# Patient Record
Sex: Female | Born: 1980 | Race: White | Hispanic: No | Marital: Single | Smoking: Heavy tobacco smoker
Health system: Southern US, Community
[De-identification: ages and names within clinical notes are randomized; demographics above are authoritative.]

## PROBLEM LIST (undated history)

## (undated) DIAGNOSIS — J40 Bronchitis, not specified as acute or chronic: Secondary | ICD-10-CM

---

## 2002-11-22 ENCOUNTER — Other Ambulatory Visit: Admission: RE | Admit: 2002-11-22 | Discharge: 2002-11-22 | Payer: Self-pay | Admitting: Obstetrics and Gynecology

## 2003-01-10 ENCOUNTER — Ambulatory Visit (HOSPITAL_COMMUNITY): Admission: RE | Admit: 2003-01-10 | Discharge: 2003-01-10 | Payer: Self-pay | Admitting: Obstetrics and Gynecology

## 2003-01-10 ENCOUNTER — Encounter: Payer: Self-pay | Admitting: Obstetrics and Gynecology

## 2003-03-20 ENCOUNTER — Inpatient Hospital Stay (HOSPITAL_COMMUNITY): Admission: AD | Admit: 2003-03-20 | Discharge: 2003-03-20 | Payer: Self-pay | Admitting: Obstetrics and Gynecology

## 2003-05-08 ENCOUNTER — Other Ambulatory Visit: Admission: RE | Admit: 2003-05-08 | Discharge: 2003-05-08 | Payer: Self-pay | Admitting: Obstetrics and Gynecology

## 2003-06-26 ENCOUNTER — Inpatient Hospital Stay (HOSPITAL_COMMUNITY): Admission: AD | Admit: 2003-06-26 | Discharge: 2003-06-29 | Payer: Self-pay | Admitting: Obstetrics and Gynecology

## 2003-07-19 ENCOUNTER — Encounter: Admission: RE | Admit: 2003-07-19 | Discharge: 2003-08-18 | Payer: Self-pay | Admitting: Obstetrics and Gynecology

## 2003-09-18 ENCOUNTER — Encounter: Admission: RE | Admit: 2003-09-18 | Discharge: 2003-10-18 | Payer: Self-pay | Admitting: Obstetrics and Gynecology

## 2003-11-18 ENCOUNTER — Encounter: Admission: RE | Admit: 2003-11-18 | Discharge: 2003-12-18 | Payer: Self-pay | Admitting: Obstetrics and Gynecology

## 2003-12-19 ENCOUNTER — Encounter: Admission: RE | Admit: 2003-12-19 | Discharge: 2004-01-18 | Payer: Self-pay | Admitting: Obstetrics and Gynecology

## 2009-07-26 ENCOUNTER — Emergency Department (HOSPITAL_COMMUNITY): Admission: EM | Admit: 2009-07-26 | Discharge: 2009-07-27 | Payer: Self-pay | Admitting: Emergency Medicine

## 2010-02-18 ENCOUNTER — Emergency Department (HOSPITAL_COMMUNITY): Admission: EM | Admit: 2010-02-18 | Discharge: 2010-02-18 | Payer: Self-pay | Admitting: Emergency Medicine

## 2010-12-23 ENCOUNTER — Emergency Department (HOSPITAL_COMMUNITY)
Admission: EM | Admit: 2010-12-23 | Discharge: 2010-12-23 | Disposition: A | Discharge status: Home / Self Care | Payer: Self-pay | Attending: Emergency Medicine | Admitting: Emergency Medicine

## 2010-12-23 DIAGNOSIS — J029 Acute pharyngitis, unspecified: Secondary | ICD-10-CM | POA: Insufficient documentation

## 2010-12-23 LAB — RAPID STREP SCREEN (MED CTR MEBANE ONLY): Streptococcus, Group A Screen (Direct): NEGATIVE

## 2011-01-12 LAB — RAPID STREP SCREEN (MED CTR MEBANE ONLY): Streptococcus, Group A Screen (Direct): POSITIVE — AB

## 2011-01-28 LAB — STREP A DNA PROBE: Group A Strep Probe: NEGATIVE

## 2011-01-28 LAB — RAPID STREP SCREEN (MED CTR MEBANE ONLY): Streptococcus, Group A Screen (Direct): NEGATIVE
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
## 2011-03-12 NOTE — H&P (Signed)
   [name], [name]                         ACCOUNT NO.:  [id number]   MEDICAL RECORD NO.:  [id number]                   PATIENT TYPE:  INP   LOCATION:  9170                                 [facility]   PHYSICIAN:  Janine Limbo, M.D.            DATE OF BIRTH:  1981/08/09   DATE OF ADMISSION:  06/26/2003  DATE OF DISCHARGE:                                HISTORY & PHYSICAL   HISTORY OF PRESENT ILLNESS:  [name] is a 30 year old gravida 1 para 0 at  68 and five-sevenths weeks; EDD determined by LMP dates and confirmed with  10 week six day ultrasound and follow-up ultrasound.  Her EDD is June 14, 2003.  She presents for induction of labor secondary to post dates.  Reports  positive fetal movement, no bleeding, no rupture of membranes.  She is  having mild irregular contractions and denies any PIH symptoms.  Her  pregnancy has been followed by the C.N.M. service at [facility] and is remarkable  for:  1. First trimester spotting.  2. Obesity.  3. Smoker - quit with positive UPT in December 2004.  4. Group B strep negative.   This is the patient's first pregnancy.  She presented to the office of [facility]  on November 22, 2002 at 10 weeks six days gestation as determined by dates  and ultrasound.  Her pregnancy has been essentially unremarkable.  She has  been size greater than dates at times.  Follow-up ultrasounds have all shown  normal growth and development.  At 32 weeks she was noted to be in the 50th  to 75th percentile with a normal AFI.  She has been normotensive with no  proteinuria throughout her pregnancy.   LABORATORY DATA:  Prenatal laboratory work on November 22, 2002:  Hemoglobin  and hematocrit 12.1 and 37.5; platelets 300,000.  Blood type and Rh O  positive, antibody screen negative.  VDRL nonreactive.  Rubella immune.  Hepatitis B surface antigen negative.  HIV nonreactive.   Dictation ended at this point.     Rica Koyanagi, C.N.M.                Janine Limbo, M.D.    SDM/MEDQ  D:  06/26/2003  T:  06/26/2003  Job:  [id number]

## 2011-12-27 ENCOUNTER — Emergency Department (HOSPITAL_COMMUNITY)
Admission: EM | Admit: 2011-12-27 | Discharge: 2011-12-28 | Disposition: A | Discharge status: Home / Self Care | Payer: Self-pay | Attending: Emergency Medicine | Admitting: Emergency Medicine

## 2011-12-27 ENCOUNTER — Emergency Department (HOSPITAL_COMMUNITY): Payer: Self-pay

## 2011-12-27 ENCOUNTER — Encounter (HOSPITAL_COMMUNITY): Payer: Self-pay | Admitting: Emergency Medicine

## 2011-12-27 DIAGNOSIS — Z87891 Personal history of nicotine dependence: Secondary | ICD-10-CM | POA: Insufficient documentation

## 2011-12-27 DIAGNOSIS — J40 Bronchitis, not specified as acute or chronic: Secondary | ICD-10-CM | POA: Insufficient documentation

## 2011-12-27 DIAGNOSIS — R000 Tachycardia, unspecified: Secondary | ICD-10-CM | POA: Insufficient documentation

## 2011-12-27 DIAGNOSIS — R05 Cough: Secondary | ICD-10-CM | POA: Insufficient documentation

## 2011-12-27 DIAGNOSIS — R059 Cough, unspecified: Secondary | ICD-10-CM | POA: Insufficient documentation

## 2011-12-27 DIAGNOSIS — R062 Wheezing: Secondary | ICD-10-CM | POA: Insufficient documentation

## 2011-12-27 MED ORDER — ALBUTEROL SULFATE (5 MG/ML) 0.5% IN NEBU
2.5000 mg | INHALATION_SOLUTION | Freq: Once | RESPIRATORY_TRACT | Status: DC
  Filled 2011-12-27 (×2): qty 1

## 2011-12-27 NOTE — ED Notes (Signed)
PT. REPORTS SOB WITH PRODUCTIVE COUGH FOR 7 MONTHS UNRELIEVED BY OTC COYGH MEDICATIONS AND MDI. DENIES FEEVR OR CHILLS.

## 2011-12-28 ENCOUNTER — Other Ambulatory Visit: Payer: Self-pay

## 2011-12-28 ENCOUNTER — Telehealth: Payer: Self-pay | Admitting: Adult Health

## 2011-12-28 MED ORDER — ALBUTEROL SULFATE (5 MG/ML) 0.5% IN NEBU: 2.5000 mg | INHALATION_SOLUTION | Freq: Once | RESPIRATORY_TRACT | Status: DC

## 2011-12-28 MED ORDER — IPRATROPIUM BROMIDE 0.02 % IN SOLN
0.5000 mg | Freq: Once | RESPIRATORY_TRACT | Status: AC
  Administered 2011-12-28: 0.5 mg via RESPIRATORY_TRACT
  Filled 2011-12-28: qty 2.5

## 2011-12-28 MED ORDER — ALBUTEROL SULFATE (5 MG/ML) 0.5% IN NEBU
5.0000 mg | INHALATION_SOLUTION | Freq: Once | RESPIRATORY_TRACT | Status: AC
  Administered 2011-12-28: 5 mg via RESPIRATORY_TRACT

## 2011-12-28 MED ORDER — PREDNISONE 20 MG PO TABS: 40.0000 mg | ORAL_TABLET | Freq: Every day | ORAL | Status: AC

## 2011-12-28 MED ORDER — PREDNISONE 20 MG PO TABS
40.0000 mg | ORAL_TABLET | Freq: Once | ORAL | Status: AC
  Administered 2011-12-28: 40 mg via ORAL
  Filled 2011-12-28: qty 2

## 2011-12-28 MED ORDER — ALBUTEROL SULFATE HFA 108 (90 BASE) MCG/ACT IN AERS: 1.0000 | INHALATION_SPRAY | Freq: Four times a day (QID) | RESPIRATORY_TRACT | Status: DC | PRN

## 2011-12-28 MED ORDER — PREDNISONE 20 MG PO TABS
ORAL_TABLET | ORAL | Status: AC
  Filled 2011-12-28: qty 1

## 2011-12-28 NOTE — Telephone Encounter (Signed)
Called, spoke with pt who states she was seen in the ER yesterday and was told to call here for an appt with Dr. Kriste Basque.  I put pt on hold to look up ER information but then was disconnected. I called back but had to lmomtcb.  We need to find out from pt: 1.  Did they want her to establish with Dr. Kriste Basque for PCP or want her to call for appt for pulmonary f/u?  Per Tammy D, if they wanted pt to establish with Dr. Kriste Basque for PCP care, we can tell pt to call [facility] bc he is no longer accepting new pts.

## 2011-12-28 NOTE — Discharge Instructions (Signed)
Bronchitis Bronchitis is a problem of the air tubes leading to your lungs. This problem makes it hard for air to get in and out of the lungs. You may cough a lot because your air tubes are narrow. Going without care can cause lasting (chronic) bronchitis. HOME CARE   Drink enough fluids to keep your pee (urine) clear or pale yellow.   Use a cool mist humidifier.   Quit smoking if you smoke. If you keep smoking, the bronchitis might not get better.   Only take medicine as told by your doctor.  GET HELP RIGHT AWAY IF:   Coughing keeps you awake.   You start to wheeze.   You become more sick or weak.   You have a hard time breathing or get short of breath.   You cough up blood.   Coughing lasts more than 2 weeks.   You have a fever.   Your baby is older than 3 months with a rectal temperature of 102 F (38.9 C) or higher.   Your baby is 3 months old or younger with a rectal temperature of 100.4 F (38 C) or higher.  MAKE SURE YOU:  Understand these instructions.   Will watch your condition.   Will get help right away if you are not doing well or get worse.  Document Released: 03/29/2008 Document Revised: 09/30/2011 Document Reviewed: 09/12/2009 [facility] Patient Information 2012 [place], [place].  RESOURCE GUIDE  Dental Problems  Patients with Medicaid: [facility] [phone] [address]                                           [phone] [address] Phone:  [phone]                                                  Phone:  [phone]  If unable to pay or uninsured, contact:  [facility] or [facility]. to become qualified for the adult dental clinic.  Chronic Pain Problems Contact [facility]  [phone] Patients need to be referred by their primary care doctor.  Insufficient Money for Medicine Contact United Way:  call "211" or [facility] ([phone].  No Primary Care  Doctor Call [facility]  [phone] Other agencies that provide inexpensive medical care    [facility]  [phone]    [facility]  [phone]    [facility]  [phone]    [facility]  [phone]    [facility]  [phone]    [facility]  [phone]  Psychological Services [facility]  [phone] [facility] Services  [phone] [facility]   [phone] (emergency services [phone])  Substance Abuse Resources [facility]  [phone] [facility] [phone] [facility] [phone] Floydene Flock [phone] Residential & Outpatient Substance Abuse Program  [phone]  Abuse/Neglect [facility] Child Abuse Hotline (336) 629-9692 [facility] Child Abuse Hotline (272)847-8660 (After Hours)  Emergency Shelter [facility] [phone]  Maternity Homes [facility] [phone]  [employer] Services [phone]  MRSA Hotline #:   (754)398-1929    [facility] Resources  [facility]     United Way                          [facility] Dept. [address] [place]                       [address]      [address]  Blondell Reveal Phone:  [phone]                                   Phone:  [phone]                 Phone:  [phone]  [facility] Phone:  [phone]  [facility] Child Abuse Hotline [phone] (After Hours)

## 2011-12-28 NOTE — ED Notes (Signed)
Pt states that she has been having a cold since Sept. Pt states that she has been given different medications for the cold such as antibiotics, cortisone and steriods and she still has episodes where she feels like she is having difficulty breathing. Pt states that the old starts nasally and then she feels like it settles in her chest. Pt alert and oriented able to follow commands and is in no respiratory distress at this time.

## 2011-12-29 NOTE — Telephone Encounter (Signed)
LMOMTCB x 1 

## 2011-12-29 NOTE — Telephone Encounter (Signed)
I spoke with the pt and she states she hs had bronchitis x 6 months. She hs been going to urgent care and ER and has been given abx and prednisone without relief.  ER told her to F/u with Dr.Nadel, but sounds like pt needs pulmonary consult so appt set with Dr. Sherene Sires for 01-10-12 as self-referral. Pt aware. Carron Curie, CMA'

## 2012-01-03 NOTE — ED Provider Notes (Signed)
History    31 year old female with cough. Patient states that it feels like she's had a cold since September. Patient has been evaluated multiple times in urgent care and  in emergency room. Has had several courses of antibiotics and  steroids. Symptoms have waxed and waned but have not completely resolved. No fevers or chills. No unusual leg pain or swelling. Denies history of blood clot. Patient is a smoker.  CSN: [id number]  Arrival date & time 12/27/11  2228   First MD Initiated Contact with Patient 12/28/11 0107      Chief Complaint  Patient presents with  . Shortness of Breath    (Consider location/radiation/quality/duration/timing/severity/associated sxs/prior treatment) HPI  History reviewed. No pertinent past medical history.  History reviewed. No pertinent past surgical history.  No family history on file.  History  Substance Use Topics  . Smoking status: Former Games developer  . Smokeless tobacco: Not on file  . Alcohol Use: No    OB History    Grav Para Term Preterm Abortions TAB SAB Ect Mult Living                  Review of Systems   Review of symptoms negative unless otherwise noted in HPI.  Allergies  Review of patient's allergies indicates no known allergies.  Home Medications   Current Outpatient Rx  Name Route Sig Dispense Refill  . ALBUTEROL SULFATE HFA 108 (90 BASE) MCG/ACT IN AERS Inhalation Inhale 2 puffs into the lungs every 6 (six) hours as needed. For shortness of breath    . OVER THE COUNTER MEDICATION Oral Take 2 tablets by mouth 3 (three) times daily. Cold Multi-Symptom Severe    . ALBUTEROL SULFATE HFA 108 (90 BASE) MCG/ACT IN AERS Inhalation Inhale 1-2 puffs into the lungs every 6 (six) hours as needed for wheezing. 1 Inhaler 2  . PREDNISONE 20 MG PO TABS Oral Take 2 tablets (40 mg total) by mouth daily. 10 tablet 0    BP 105/50  Pulse 102  Temp(Src) 99 F (37.2 C) (Oral)  Resp 23  SpO2 98%  LMP 12/13/2011  Physical Exam  Nursing  note and vitals reviewed. Constitutional: She appears well-developed and well-nourished. No distress.  HENT:  Head: Normocephalic and atraumatic.  Eyes: Conjunctivae are normal. Right eye exhibits no discharge. Left eye exhibits no discharge.  Neck: Neck supple.  Cardiovascular: Regular rhythm and normal heart sounds.  Exam reveals no gallop and no friction rub.   No murmur heard.      Mild tachycardia with a regular rhythm.  Pulmonary/Chest: No respiratory distress. She has wheezes.       Mild scattered wheezing with no increased work of breathing.  Abdominal: Soft. She exhibits no distension. There is no tenderness.  Musculoskeletal: She exhibits no edema and no tenderness.       Lower extremities symmetric as compared to each other. No calf tenderness. Negative Homans.    Neurological: She is alert.  Skin: Skin is warm and dry.  Psychiatric: She has a normal mood and affect. Her behavior is normal. Thought content normal.    ED Course  Procedures (including critical care time)  Labs Reviewed - No data to display No results found.   1. Bronchitis       MDM  31 year old female with persistent cough. Some mild wheezing on exam. Patient has had multiple evaluations in urgent care in emergency room. Patient does not carry the diagnosis of asthma but has never had a formal evaluation  for this. She does report relief when she uses her inhaler and also after she finishes a course of steroids. Consider other causes of chronic cough such as postnasal drip or GERD but doubt with wheezing. Consider PE but doubt. Mild tachycardia noted suspect that this is related beta agonist medication. Given multiple ER and urgent care evaluations without definitive diagnosis pulmonology referral was provided. Low suspicion for emergent etiology though given exam and chronicity.         Raeford Razor, MD 01/03/12 1807

## 2012-01-10 ENCOUNTER — Institutional Professional Consult (permissible substitution): Payer: Self-pay | Admitting: Internal Medicine

## 2013-02-02 IMAGING — CR DG CHEST 2V
2 series · 2 of 2 positions shown · non-contrast
Comparison: None

CLINICAL DATA: Shortness of breath, cough, recently quit smoking,
history bronchitis

CHEST - 2 VIEW

[w chest pa]
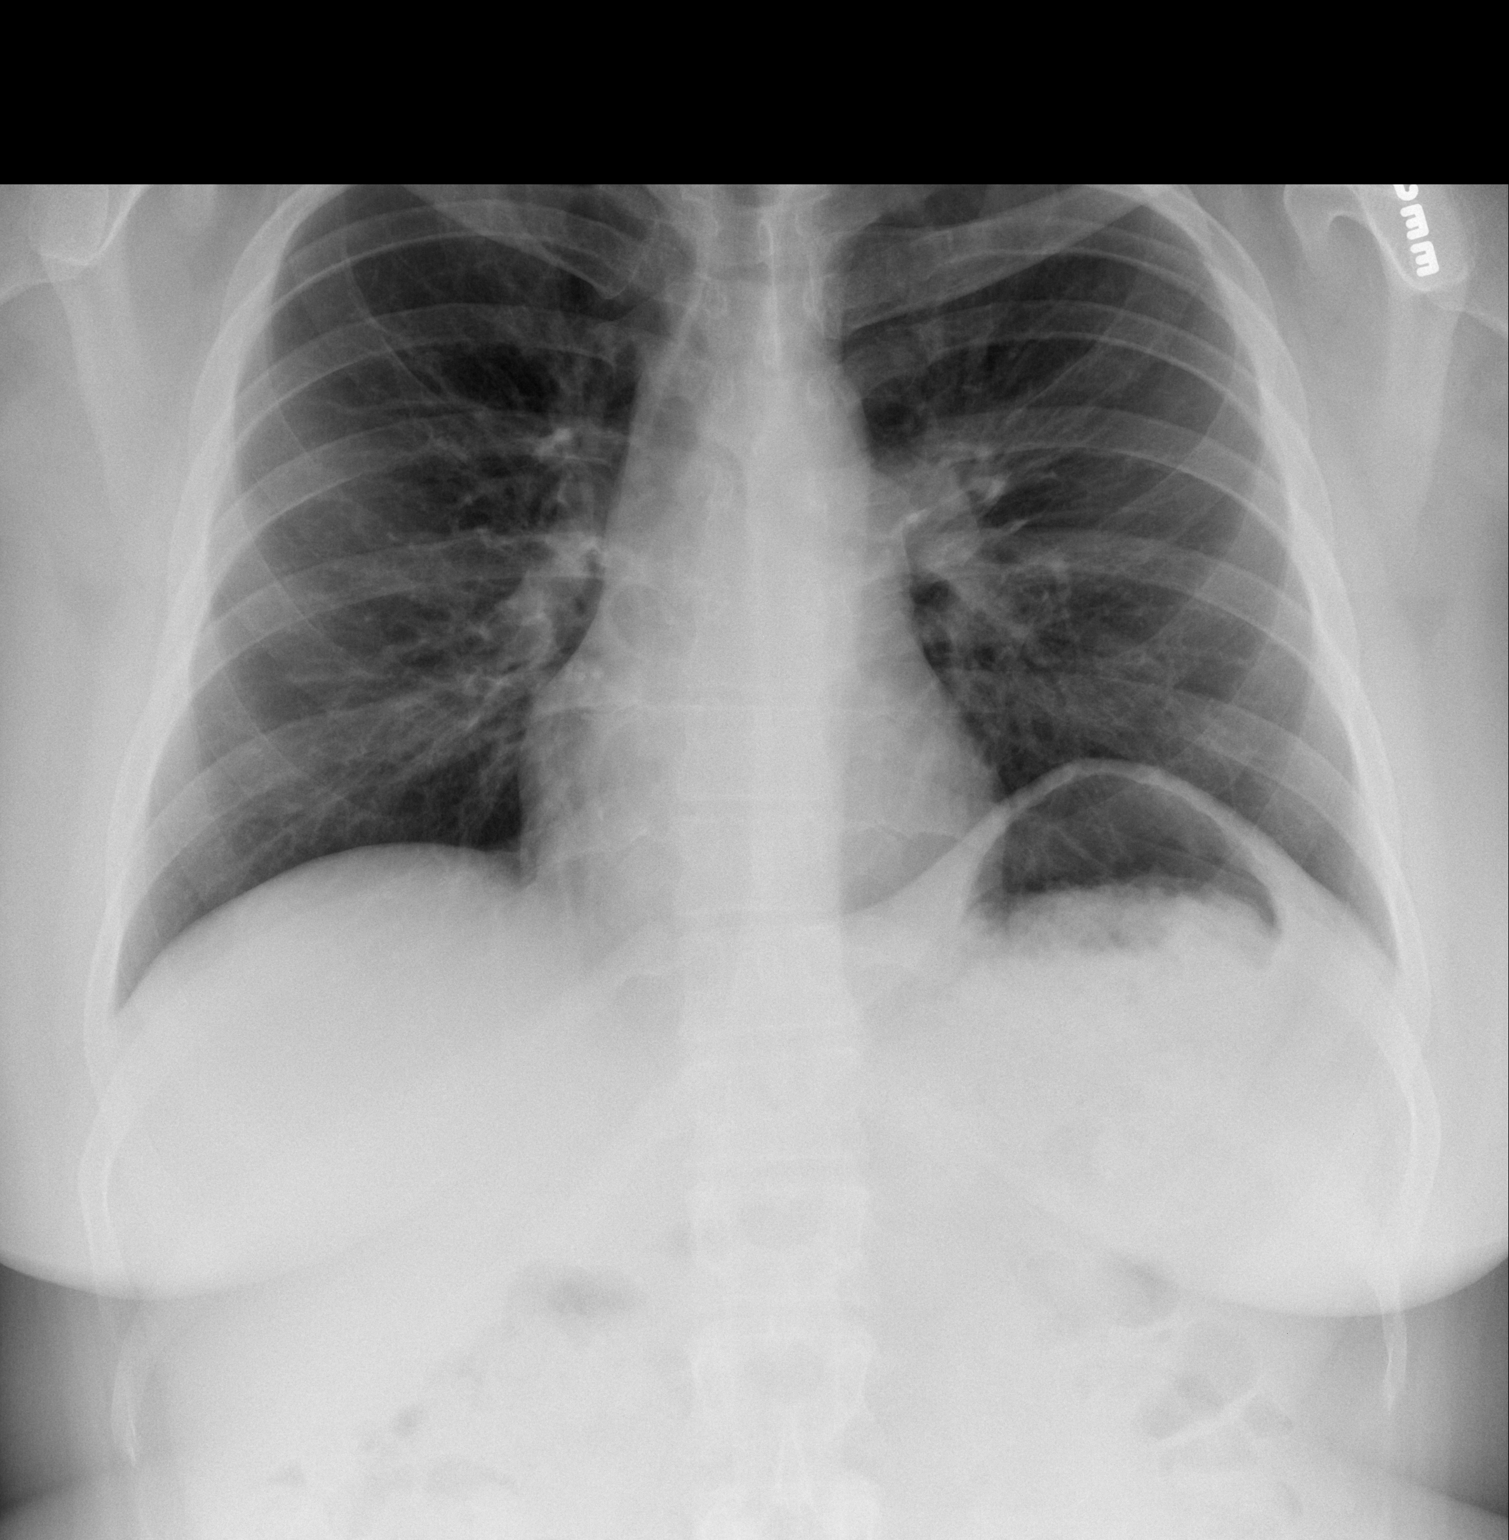

[w chest lat]
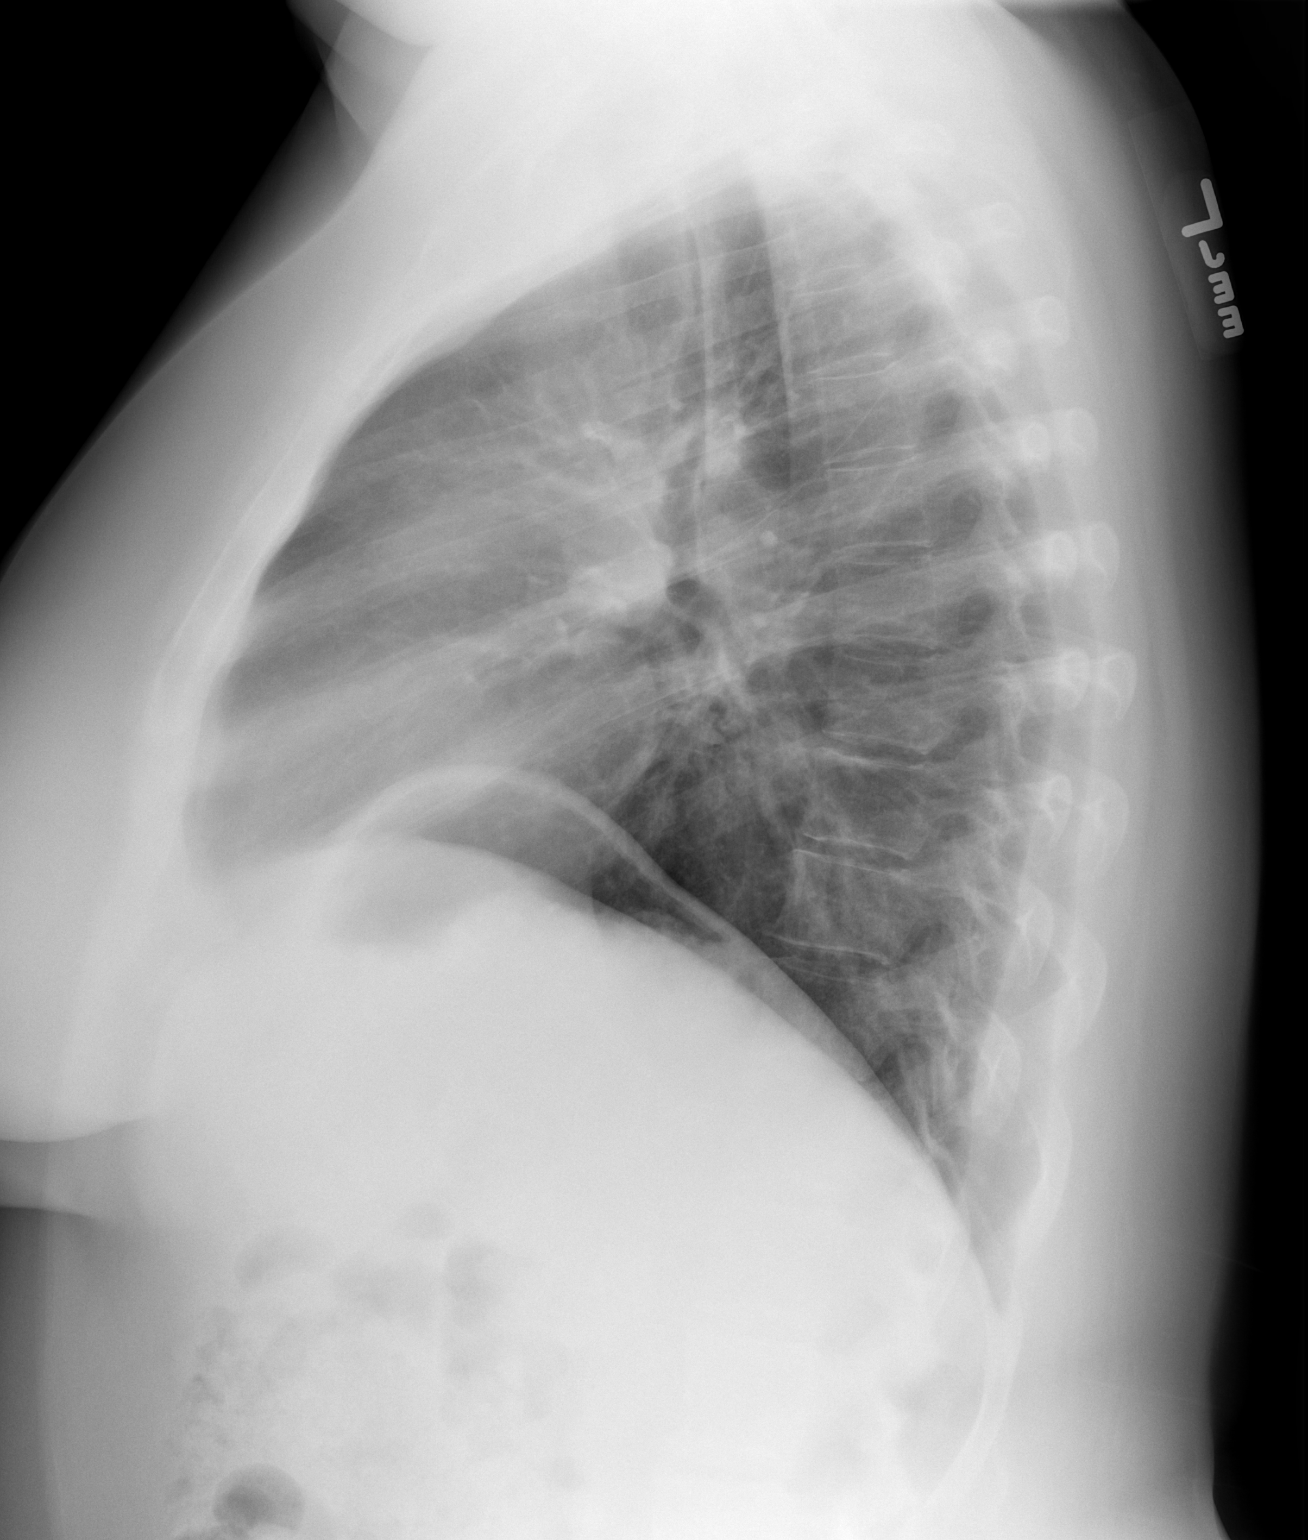

[2 of 2 positions shown; findings below may reference images not displayed]

FINDINGS: Normal heart size, mediastinal contours, and pulmonary vascularity.
Lungs clear.
Bones unremarkable.
No pneumothorax.
Incidentally noted eventration anterior left diaphragm.
IMPRESSION: No acute abnormalities.

## 2013-07-10 ENCOUNTER — Encounter (HOSPITAL_COMMUNITY): Payer: Self-pay | Admitting: Emergency Medicine

## 2013-07-10 ENCOUNTER — Emergency Department (HOSPITAL_COMMUNITY): Payer: Self-pay

## 2013-07-10 ENCOUNTER — Emergency Department (HOSPITAL_COMMUNITY)
Admission: EM | Admit: 2013-07-10 | Discharge: 2013-07-11 | Disposition: A | Discharge status: Home / Self Care | Payer: Self-pay | Attending: Emergency Medicine | Admitting: Emergency Medicine

## 2013-07-10 DIAGNOSIS — R52 Pain, unspecified: Secondary | ICD-10-CM | POA: Insufficient documentation

## 2013-07-10 DIAGNOSIS — F17200 Nicotine dependence, unspecified, uncomplicated: Secondary | ICD-10-CM | POA: Insufficient documentation

## 2013-07-10 DIAGNOSIS — R509 Fever, unspecified: Secondary | ICD-10-CM | POA: Insufficient documentation

## 2013-07-10 DIAGNOSIS — J3489 Other specified disorders of nose and nasal sinuses: Secondary | ICD-10-CM | POA: Insufficient documentation

## 2013-07-10 DIAGNOSIS — J029 Acute pharyngitis, unspecified: Secondary | ICD-10-CM | POA: Insufficient documentation

## 2013-07-10 DIAGNOSIS — J45901 Unspecified asthma with (acute) exacerbation: Secondary | ICD-10-CM | POA: Insufficient documentation

## 2013-07-10 DIAGNOSIS — Z79899 Other long term (current) drug therapy: Secondary | ICD-10-CM | POA: Insufficient documentation

## 2013-07-10 HISTORY — DX: Bronchitis, not specified as acute or chronic: J40

## 2013-07-10 MED ORDER — IPRATROPIUM BROMIDE 0.02 % IN SOLN
RESPIRATORY_TRACT | Status: AC
  Administered 2013-07-10: 0.5 mg via RESPIRATORY_TRACT
  Filled 2013-07-10: qty 2.5

## 2013-07-10 MED ORDER — IPRATROPIUM BROMIDE 0.02 % IN SOLN
0.5000 mg | Freq: Once | RESPIRATORY_TRACT | Status: AC
  Administered 2013-07-10: 0.5 mg via RESPIRATORY_TRACT

## 2013-07-10 MED ORDER — ALBUTEROL (5 MG/ML) CONTINUOUS INHALATION SOLN
10.0000 mg/h | INHALATION_SOLUTION | Freq: Once | RESPIRATORY_TRACT | Status: AC
  Administered 2013-07-10: 10 mg/h via RESPIRATORY_TRACT
  Filled 2013-07-10: qty 20

## 2013-07-10 MED ORDER — PREDNISONE 20 MG PO TABS
60.0000 mg | ORAL_TABLET | Freq: Once | ORAL | Status: AC
  Administered 2013-07-10: 60 mg via ORAL
  Filled 2013-07-10: qty 3

## 2013-07-10 MED ORDER — ALBUTEROL SULFATE (5 MG/ML) 0.5% IN NEBU
5.0000 mg | INHALATION_SOLUTION | Freq: Once | RESPIRATORY_TRACT | Status: AC
  Administered 2013-07-10: 5 mg via RESPIRATORY_TRACT
  Filled 2013-07-10: qty 1

## 2013-07-10 MED ORDER — PREDNISONE 10 MG PO TABS: 20.0000 mg | ORAL_TABLET | Freq: Every day | ORAL | Status: DC

## 2013-07-10 MED ORDER — ALBUTEROL SULFATE HFA 108 (90 BASE) MCG/ACT IN AERS
2.0000 | INHALATION_SPRAY | RESPIRATORY_TRACT | Status: DC | PRN
  Administered 2013-07-11: 2 via RESPIRATORY_TRACT
  Filled 2013-07-10: qty 6.7

## 2013-07-10 NOTE — ED Notes (Signed)
Pt reports that she has been feeling ShOB with a cough for the past few days, felt dizzy at work today and had to leave early.  Pt reports chills and generalized body aches.

## 2013-07-10 NOTE — ED Notes (Signed)
RT paged for breathing treatment at this time

## 2013-07-10 NOTE — ED Provider Notes (Signed)
CSN: [id number]     Arrival date & time 07/10/13  2012 History  This chart was scribed for non-physician practitioner, Jeanette Favor, FNP working with Jeanette Argyle, MD by Jeanette [name], ED scribe. This patient was seen in room WTR2/WLPT2 and the patient's care was started at 9:14 PM.   Chief Complaint  Patient presents with  . Shortness of Breath  . Cough   The history is provided by the patient. No language interpreter was used.    HPI Comments: Jeanette [name] is a 32 y.o. female who presents to the Emergency Department complaining of shortness of breath with associated cough that started yesterday. Pt also had chills and generalized body aches. She states she woke up with a sore throat and mild fever today. Pt has been using her son's inhaler because she can't afford one of her own since she has no insurance. She states the breathing treatment she was given in the ED is helping.   Past Medical History  Diagnosis Date  . Bronchitis    History reviewed. No pertinent past surgical history. History reviewed. No pertinent family history. History  Substance Use Topics  . Smoking status: Heavy Tobacco Smoker -- 1.00 packs/day    Types: Cigarettes  . Smokeless tobacco: Not on file  . Alcohol Use: No   OB History   Grav Para Term Preterm Abortions TAB SAB Ect Mult Living                 Review of Systems  Constitutional: Positive for fever and chills.  HENT: Positive for sore throat, rhinorrhea and postnasal drip. Negative for trouble swallowing.   Respiratory: Positive for cough, shortness of breath and wheezing.   All other systems reviewed and are negative.    Allergies  Review of patient's allergies indicates no known allergies.  Home Medications   Current Outpatient Rx  Name  Route  Sig  Dispense  Refill  . albuterol (PROVENTIL HFA;VENTOLIN HFA) 108 (90 BASE) MCG/ACT inhaler   Inhalation   Inhale 2 puffs into the lungs every 6 (six) hours as needed. For shortness  of breath         . dextromethorphan (DELSYM) 30 MG/5ML liquid   Oral   Take 60 mg by mouth as needed for cough.         . loratadine (CLARITIN) 10 MG tablet   Oral   Take 10 mg by mouth as needed for allergies.         . predniSONE (DELTASONE) 10 MG tablet   Oral   Take 2 tablets (20 mg total) by mouth daily.   15 tablet   0    BP 152/118  Pulse 118  Temp(Src) 100.7 F (38.2 C) (Oral)  Resp 22  SpO2 92%  LMP 06/19/2013  Physical Exam  Nursing note and vitals reviewed. Constitutional: She is oriented to person, place, and time. She appears well-developed and well-nourished. No distress.  HENT:  Head: Normocephalic and atraumatic.  Throat is mildly red but no exudate.   Eyes: EOM are normal.  Neck: Neck supple. No tracheal deviation present.  Cardiovascular: Normal rate.   Pulmonary/Chest: Effort normal. No respiratory distress.  Musculoskeletal: Normal range of motion.  Neurological: She is alert and oriented to person, place, and time.  Skin: Skin is warm and dry.  Psychiatric: She has a normal mood and affect. Her behavior is normal.    ED Course  Procedures (including critical care time)  DIAGNOSTIC STUDIES: Oxygen Saturation  is 92% on RA, normal by my interpretation.    COORDINATION OF CARE: 9:16 PM-Discussed treatment plan which includes steroids and inhaler with pt at bedside and pt agreed to plan.   Labs Review Labs Reviewed - No data to display Imaging Review Dg Chest 2 View  07/10/2013   CLINICAL DATA:  Chest pain and cough  EXAM: CHEST  2 VIEW  COMPARISON:  12/27/2011  FINDINGS: The heart size and mediastinal contours are within normal limits. Both lungs are clear. The visualized skeletal structures are unremarkable.  IMPRESSION: No active cardiopulmonary disease.   Electronically Signed   By: Jeanette [name]   On: 07/10/2013 21:08    MDM   1. Asthma exacerbation     Is with a history of reactive airway disease/bronchitis.  I feel that  this is exacerbation of her bronchitis.  She does smoke on a regular basis.  She states, when she uses her son's inhalers.  The breathing easier Patient received 2 treatments in the emergency Department 1 with that lasted approximately one hour.  She feels much, better.  There is no wheezing.  After the second hour-long treatment.  Her O2 sats are still low 90s.  She was ambulated.  Her sats stayed between 89 and 91%.  Patient was asymptomatic denying dizziness.  She was steady on her feet.  She'll be discharged him with an albuterol inhaler, as well, as a prescription for steroids.  She has been informed that she should return immediately if she develops new or worsening symptoms.  She denied/refused admission     I personally performed the services described in this documentation, which was scribed in my presence. The recorded information has been reviewed and is accurate.  Jeanette Filter, NP 07/10/13 [phone]

## 2013-07-10 NOTE — ED Notes (Signed)
Pt ambulated on rm air. Pt's O2 was 91 before walking and during the walk it drooped to 89% but came back up and stayed at 90% for the remaining of the walk.

## 2013-07-11 NOTE — ED Provider Notes (Signed)
Medical screening examination/treatment/procedure(s) were performed by non-physician practitioner and as supervising physician I was immediately available for consultation/collaboration.   Junius Argyle, MD 07/11/13 [phone]

## 2014-12-06 ENCOUNTER — Emergency Department (HOSPITAL_COMMUNITY)
Admission: EM | Admit: 2014-12-06 | Discharge: 2014-12-06 | Disposition: A | Discharge status: Home / Self Care | Payer: Self-pay | Attending: Emergency Medicine | Admitting: Emergency Medicine

## 2014-12-06 ENCOUNTER — Emergency Department (HOSPITAL_COMMUNITY): Payer: Self-pay

## 2014-12-06 ENCOUNTER — Encounter (HOSPITAL_COMMUNITY): Payer: Self-pay | Admitting: Emergency Medicine

## 2014-12-06 DIAGNOSIS — Z720 Tobacco use: Secondary | ICD-10-CM | POA: Insufficient documentation

## 2014-12-06 DIAGNOSIS — R0602 Shortness of breath: Secondary | ICD-10-CM

## 2014-12-06 DIAGNOSIS — J40 Bronchitis, not specified as acute or chronic: Secondary | ICD-10-CM | POA: Insufficient documentation

## 2014-12-06 DIAGNOSIS — Z7952 Long term (current) use of systemic steroids: Secondary | ICD-10-CM | POA: Insufficient documentation

## 2014-12-06 DIAGNOSIS — Z79899 Other long term (current) drug therapy: Secondary | ICD-10-CM | POA: Insufficient documentation

## 2014-12-06 MED ORDER — GUAIFENESIN ER 1200 MG PO TB12: 1.0000 | ORAL_TABLET | Freq: Two times a day (BID) | ORAL | Status: AC

## 2014-12-06 MED ORDER — PREDNISONE 20 MG PO TABS
60.0000 mg | ORAL_TABLET | Freq: Once | ORAL | Status: AC
  Administered 2014-12-06: 60 mg via ORAL
  Filled 2014-12-06: qty 3

## 2014-12-06 MED ORDER — ALBUTEROL SULFATE HFA 108 (90 BASE) MCG/ACT IN AERS
2.0000 | INHALATION_SPRAY | RESPIRATORY_TRACT | Status: DC | PRN
  Filled 2014-12-06: qty 6.7

## 2014-12-06 MED ORDER — AEROCHAMBER PLUS FLO-VU MEDIUM MISC
1.0000 | Freq: Once | Status: DC
  Filled 2014-12-06 (×2): qty 1

## 2014-12-06 MED ORDER — PREDNISONE 50 MG PO TABS: 50.0000 mg | ORAL_TABLET | Freq: Every day | ORAL | Status: AC

## 2014-12-06 MED ORDER — ALBUTEROL (5 MG/ML) CONTINUOUS INHALATION SOLN
10.0000 mg/h | INHALATION_SOLUTION | RESPIRATORY_TRACT | Status: AC
  Administered 2014-12-06: 10 mg/h via RESPIRATORY_TRACT
  Filled 2014-12-06: qty 20

## 2014-12-06 MED ORDER — ACETAMINOPHEN-CODEINE 120-12 MG/5ML PO SOLN: 10.0000 mL | ORAL | Status: AC | PRN

## 2014-12-06 NOTE — Progress Notes (Signed)
EDCM spoke to patient at bedside. Patient confirms she does not have a pcp or insurance [place].  EDCM provide patient with pamphlet to [facility], informed patient of services there and walk in times.  EDCM also provided patient with list of pcps who accept self pay patients, list of discount pharmacies and websites needymeds.org and GoodRX.com for medication assistance, phone number to inquire about the orange card, phone number to inquire about Mediciad, phone number to inquire about the Affordable Care Act, financial resources in the community such as local churches, salvation army, urban ministries, and dental assistance for uninsured patients.  [facility] information [facility]  [facility] center and provided hours of operation. Patient thankful for resources.  No further EDCM needs at this time.

## 2014-12-06 NOTE — Discharge Instructions (Signed)
Return here as needed.  Follow-up with the clinic provided.  Increase your fluid intake and rest as much as possible °

## 2014-12-06 NOTE — ED Provider Notes (Signed)
CSN: [id number]     Arrival date & time 12/06/14  4098 History   First MD Initiated Contact with Patient 12/06/14 1956     Chief Complaint  Patient presents with  . Shortness of Breath     (Consider location/radiation/quality/duration/timing/severity/associated sxs/prior Treatment) HPI  Patient with PMH of asthma exacerbations and is a current smoker presents today complaining of SOB and prolonged cough since December 2015.  At the beginning of December she noticed a sore throat, cough, and fever.  It resolved, however the cough remained.  Now, she endorses SOB and a productive cough with occasional blood tinged sputum.  Associated sxs include night sweats and rhinorrhea.  Denies fever, HA, eye pain, nuchal rigidity, sore throat, facial pain, otalgia, abdominal pain, N/V/D/C.  It is worse at night.  She did use an expired albuterol inhaler and found periodic relief.   Past Medical History  Diagnosis Date  . Bronchitis    History reviewed. No pertinent past surgical history. History reviewed. No pertinent family history. History  Substance Use Topics  . Smoking status: Heavy Tobacco Smoker -- 1.00 packs/day    Types: Cigarettes  . Smokeless tobacco: Not on file  . Alcohol Use: No   OB History    No data available     Review of Systems All other systems negative except as documented in the HPI. All pertinent positives and negatives as reviewed in the HPI.   Allergies  Review of patient's allergies indicates no known allergies.  Home Medications   Prior to Admission medications   Medication Sig Start Date End Date Taking? Authorizing Provider  albuterol (PROVENTIL HFA;VENTOLIN HFA) 108 (90 BASE) MCG/ACT inhaler Inhale 2 puffs into the lungs every 6 (six) hours as needed for shortness of breath (shortness of breath). For shortness of breath   Yes Historical Provider, MD  Dextromethorphan-Guaifenesin (MUCINEX DM PO) Take 30 mLs by mouth at bedtime as needed (cough).   Yes  Historical Provider, MD  sodium-potassium bicarbonate (ALKA-SELTZER GOLD) TBEF dissolvable tablet Take 1 tablet by mouth daily as needed (cold symptoms).   Yes Historical Provider, MD  Throat Lozenges (LOZENGES MT) Use as directed 1 lozenge in the mouth or throat every 2 (two) hours as needed (cough).   Yes Historical Provider, MD  loratadine (CLARITIN) 10 MG tablet Take 10 mg by mouth as needed for allergies.    Historical Provider, MD  predniSONE (DELTASONE) 10 MG tablet Take 2 tablets (20 mg total) by mouth daily. Patient not taking: Reported on 12/06/2014 07/10/13   Arman Filter, NP   BP 114/66 mmHg  Pulse 80  Temp(Src) 98 F (36.7 C) (Oral)  Resp 16  SpO2 95%  LMP 11/05/2014 Physical Exam  Constitutional: She is oriented to person, place, and time. She appears well-developed and well-nourished. No distress.  HENT:  Head: Normocephalic and atraumatic.  Right Ear: External ear normal.  Left Ear: External ear normal.  Nose: Nose normal.  Mouth/Throat: Uvula is midline, oropharynx is clear and moist and mucous membranes are normal. No uvula swelling. No oropharyngeal exudate, posterior oropharyngeal edema, posterior oropharyngeal erythema or tonsillar abscesses.  Eyes: Conjunctivae and EOM are normal. Pupils are equal, round, and reactive to light.  Neck: Normal range of motion. Neck supple.  Cardiovascular: Normal rate, regular rhythm and normal heart sounds.   No murmur heard. Pulmonary/Chest: Effort normal. No stridor. No respiratory distress. She has wheezes in the right upper field, the right middle field, the right lower field, the left upper field,  the left middle field and the left lower field. She has rhonchi in the right lower field and the left lower field.  Abdominal: Soft. She exhibits no distension.  Neurological: She is alert and oriented to person, place, and time.  Skin: Skin is warm and dry.    ED Course  Procedures (including critical care time)  Imaging  Review Dg Chest 2 View  12/06/2014   CLINICAL DATA:  Chronic onset of cough, congestion, shortness of breath and chest pain. Initial encounter.  EXAM: CHEST  2 VIEW  COMPARISON:  Chest radiograph performed 07/10/2013  FINDINGS: The lungs are well-aerated. There is mild stable elevation of the left hemidiaphragm, reflecting air within the stomach. There is no evidence of focal opacification, pleural effusion or pneumothorax.  The heart is normal in size; the mediastinal contour is within normal limits. No acute osseous abnormalities are seen.  IMPRESSION: No acute cardiopulmonary process seen.   Electronically Signed   By: Roanna RaiderJeffery  Chang M.D.   On: 12/06/2014 21:15     EKG Interpretation   Date/Time:  Friday December 06 2014 19:05:34 EST Ventricular Rate:  80 PR Interval:  121 QRS Duration: 78 QT Interval:  366 QTC Calculation: 422 R Axis:   51 Text Interpretation:  Sinus rhythm Confirmed by Freida BusmanALLEN  MD, ANTHONY ([id number])  on 12/06/2014 9:50:55 PM       I should be treated for bronchitis and advised to follow-up with her primary care doctor.  The patient is feeling fast improvement following an hour-long neb treatment and steroids.  I advised her to increase her fluid intake and rest as much as possible and also advised her that cutting down smoking will help her condition dramatically  MDM   Final diagnoses:  SOB (shortness of breath)         Carlyle DollyChristopher W Altheria Shadoan, PA-C 12/06/14 2229  Toy BakerAnthony T Allen, MD 12/07/14 1517

## 2014-12-06 NOTE — ED Notes (Signed)
Pt reports she has been sick for about 2 months. Pt states she has a similar flare up a year ago and was dx with bronchitis or asthma (unable to recall). Pt is alert and oriented. Productive cough present with slight labored breathing.

## 2019-01-17 ENCOUNTER — Telehealth: Payer: Self-pay | Admitting: Family

## 2019-01-17 DIAGNOSIS — Z7189 Other specified counseling: Secondary | ICD-10-CM

## 2019-01-17 DIAGNOSIS — R6889 Other general symptoms and signs: Secondary | ICD-10-CM

## 2019-01-17 MED ORDER — BENZONATATE 100 MG PO CAPS: 100.0000 mg | ORAL_CAPSULE | Freq: Three times a day (TID) | ORAL | 0 refills | Status: AC | PRN

## 2019-01-17 NOTE — Progress Notes (Signed)
E-Visit for Corona Virus Screening  Based on your current symptoms, you may very well have the virus, however your symptoms are mild. Currently, not all patients are being tested. If the symptoms are mild and there is not a known exposure, performing the test is not indicated.   Approximately 5 minutes was spent documenting and reviewing patient's chart.    Coronavirus disease 2019 (COVID-19) is a respiratory illness that can spread from person to person. The virus that causes COVID-19 is a new virus that was first identified in the country of [place] but is now found in multiple other countries and has spread to the [place].  Symptoms associated with the virus are mild to severe fever, cough, and shortness of breath. There is currently no vaccine to protect against COVID-19, and there is no specific antiviral treatment for the virus.   To be considered HIGH RISK for Coronavirus (COVID-19), you have to meet the following criteria:  . Traveled to [place], [place], [place], [place] or [place]; or in the [place] to [place], [place], [place], or [place]; and have fever, cough, and shortness of breath within the last 2 weeks of travel OR  . Been in close contact with a person diagnosed with COVID-19 within the last 2 weeks and have fever, cough, and shortness of breath  . IF YOU DO NOT MEET THESE CRITERIA, YOU ARE CONSIDERED LOW RISK FOR COVID-19.   It is vitally important that if you feel that you have an infection such as this virus or any other virus that you stay home and away from places where you may spread it to others.  You should self-quarantine for 14 days if you have symptoms that could potentially be coronavirus and avoid contact with people age 65 and older.   You can use medication such as A prescription cough medication called Tessalon Perles 100 mg. You may take 1-2 capsules every 8 hours as needed for cough  You may also take acetaminophen (Tylenol) as needed for  fever.   Reduce your risk of any infection by using the same precautions used for avoiding the common cold or flu:  . Wash your hands often with soap and warm water for at least 20 seconds.  If soap and water are not readily available, use an alcohol-based hand sanitizer with at least 60% alcohol.  . If coughing or sneezing, cover your mouth and nose by coughing or sneezing into the elbow areas of your shirt or coat, into a tissue or into your sleeve (not your hands). . Avoid shaking hands with others and consider head nods or verbal greetings only. . Avoid touching your eyes, nose, or mouth with unwashed hands.  . Avoid close contact with people who are sick. . Avoid places or events with large numbers of people in one location, like concerts or sporting events. . Carefully consider travel plans you have or are making. . If you are planning any travel outside or inside the [place], visit the CDC's Travelers' Health webpage for the latest health notices. . If you have some symptoms but not all symptoms, continue to monitor at home and seek medical attention if your symptoms worsen. . If you are having a medical emergency, call 911.  HOME CARE . Only take medications as instructed by your medical team. . Drink plenty of fluids and get plenty of rest. . A steam or ultrasonic humidifier can help if you have congestion.   GET HELP RIGHT AWAY IF: .   You develop worsening fever. . You become short of breath . You cough up blood. . Your symptoms become more severe MAKE SURE YOU   Understand these instructions.  Will watch your condition.  Will get help right away if you are not doing well or get worse.  Your e-visit answers were reviewed by a board certified advanced clinical practitioner to complete your personal care plan.  Depending on the condition, your plan could have included both over the counter or prescription medications.  If there is a problem please reply once you have received a  response from your provider. Your safety is important to us.  If you have drug allergies check your prescription carefully.    You can use MyChart to ask questions about today's visit, request a non-urgent call back, or ask for a work or school excuse for 24 hours related to this e-Visit. If it has been greater than 24 hours you will need to follow up with your provider, or enter a new e-Visit to address those concerns. You will get an e-mail in the next two days asking about your experience.  I hope that your e-visit has been valuable and will speed your recovery. Thank you for using e-visits.    

## 2019-07-18 ENCOUNTER — Other Ambulatory Visit: Payer: Self-pay

## 2019-07-18 DIAGNOSIS — Z20822 Contact with and (suspected) exposure to covid-19: Secondary | ICD-10-CM

## 2019-07-20 LAB — NOVEL CORONAVIRUS, NAA: SARS-CoV-2, NAA: NOT DETECTED

## 2020-02-02 ENCOUNTER — Ambulatory Visit: Payer: Medicaid Other | Attending: Internal Medicine

## 2020-02-02 DIAGNOSIS — Z23 Encounter for immunization: Secondary | ICD-10-CM

## 2020-02-02 NOTE — Progress Notes (Signed)
   Covid-19 Vaccination Clinic  Name:  Jeanette [name]    MRN: [id number] DOB: [birth year]  02/02/2020  [name] was observed post Covid-19 immunization for 30 minutes based on pre-vaccination screening without incident. She was provided with Vaccine Information Sheet and instruction to access the V-Safe system.   [name] was instructed to call 911 with any severe reactions post vaccine: Marland Kitchen Difficulty breathing  . Swelling of face and throat  . A fast heartbeat  . A bad rash all over body  . Dizziness and weakness   Immunizations Administered    Name Date Dose VIS Date Route   Pfizer COVID-19 Vaccine 02/02/2020  8:32 AM 0.3 mL 10/05/2019 Intramuscular   Manufacturer: [employer], [employer]   Lot: [id number]   NDC: [address]-7

## 2020-02-25 ENCOUNTER — Ambulatory Visit: Payer: Medicaid Other | Attending: Internal Medicine

## 2020-02-25 DIAGNOSIS — Z23 Encounter for immunization: Secondary | ICD-10-CM

## 2020-02-25 NOTE — Progress Notes (Signed)
   Covid-19 Vaccination Clinic  Name:  CHERAE MARTON    MRN: [id number] DOB: 11-01-1980  02/25/2020  Ms. Lillibridge was observed post Covid-19 immunization for 30 minutes based on pre-vaccination screening without incident. She was provided with Vaccine Information Sheet and instruction to access the V-Safe system.   Ms. Plaut was instructed to call 911 with any severe reactions post vaccine: Marland Kitchen Difficulty breathing  . Swelling of face and throat  . A fast heartbeat  . A bad rash all over body  . Dizziness and weakness   Immunizations Administered    Name Date Dose VIS Date Route   Pfizer COVID-19 Vaccine 02/25/2020 12:10 PM 0.3 mL 12/19/2018 Intramuscular   Manufacturer: [employer], [employer]   Lot: [id number]   NDC: [address]-8

## 2020-05-06 DIAGNOSIS — Z20822 Contact with and (suspected) exposure to covid-19: Secondary | ICD-10-CM | POA: Diagnosis not present

## 2020-05-06 DIAGNOSIS — Z03818 Encounter for observation for suspected exposure to other biological agents ruled out: Secondary | ICD-10-CM | POA: Diagnosis not present

## 2022-06-20 ENCOUNTER — Other Ambulatory Visit: Payer: Self-pay

## 2022-06-20 ENCOUNTER — Emergency Department (HOSPITAL_COMMUNITY)
Admission: EM | Admit: 2022-06-20 | Discharge: 2022-06-20 | Discharge status: Against Medical Advice | Payer: BC Managed Care – PPO | Attending: Emergency Medicine | Admitting: Emergency Medicine

## 2022-06-20 ENCOUNTER — Emergency Department (HOSPITAL_COMMUNITY): Payer: BC Managed Care – PPO

## 2022-06-20 ENCOUNTER — Encounter (HOSPITAL_COMMUNITY): Payer: Self-pay

## 2022-06-20 DIAGNOSIS — R519 Headache, unspecified: Secondary | ICD-10-CM | POA: Diagnosis present

## 2022-06-20 DIAGNOSIS — R059 Cough, unspecified: Secondary | ICD-10-CM | POA: Diagnosis not present

## 2022-06-20 DIAGNOSIS — Z5321 Procedure and treatment not carried out due to patient leaving prior to being seen by health care provider: Secondary | ICD-10-CM | POA: Diagnosis not present

## 2022-06-20 DIAGNOSIS — R112 Nausea with vomiting, unspecified: Secondary | ICD-10-CM | POA: Diagnosis not present

## 2022-06-20 LAB — COMPREHENSIVE METABOLIC PANEL
ALT: 19 U/L (ref 0–44)
AST: 19 U/L (ref 15–41)
Albumin: 3.6 g/dL (ref 3.5–5.0)
Alkaline Phosphatase: 70 U/L (ref 38–126)
Anion gap: 7 (ref 5–15)
BUN: 10 mg/dL (ref 6–20)
CO2: 26 mmol/L (ref 22–32)
Calcium: 8.6 mg/dL — ABNORMAL LOW (ref 8.9–10.3)
Chloride: 105 mmol/L (ref 98–111)
Creatinine, Ser: 0.77 mg/dL (ref 0.44–1.00)
GFR, Estimated: >60 mL/min (ref >60)
Glucose, Bld: 103 mg/dL — ABNORMAL HIGH (ref 70–99)
Potassium: 3.7 mmol/L (ref 3.5–5.1)
Sodium: 138 mmol/L (ref 135–145)
Total Bilirubin: 0.3 mg/dL (ref 0.3–1.2)
Total Protein: 7 g/dL (ref 6.5–8.1)

## 2022-06-20 LAB — URINALYSIS, ROUTINE W REFLEX MICROSCOPIC
Bilirubin Urine: NEGATIVE
Glucose, UA: NEGATIVE mg/dL
Hgb urine dipstick: NEGATIVE
Ketones, ur: NEGATIVE mg/dL
Leukocytes,Ua: NEGATIVE
Nitrite: NEGATIVE
Protein, ur: NEGATIVE mg/dL
Specific Gravity, Urine: 1.005 (ref 1.005–1.030)
pH: 7 (ref 5.0–8.0)

## 2022-06-20 LAB — CBC WITH DIFFERENTIAL/PLATELET
Abs Immature Granulocytes: 0.03 10*3/uL (ref 0.00–0.07)
Basophils Absolute: 0.1 10*3/uL (ref 0.0–0.1)
Basophils Relative: 1 %
Eosinophils Absolute: 0.3 10*3/uL (ref 0.0–0.5)
Eosinophils Relative: 5 %
HCT: 44.8 % (ref 36.0–46.0)
Hemoglobin: 14.3 g/dL (ref 12.0–15.0)
Immature Granulocytes: 0 %
Lymphocytes Relative: 26 %
Lymphs Abs: 1.9 10*3/uL (ref 0.7–4.0)
MCH: 27.9 pg (ref 26.0–34.0)
MCHC: 31.9 g/dL (ref 30.0–36.0)
MCV: 87.3 fL (ref 80.0–100.0)
Monocytes Absolute: 0.8 10*3/uL (ref 0.1–1.0)
Monocytes Relative: 11 %
Neutro Abs: 4.1 10*3/uL (ref 1.7–7.7)
Neutrophils Relative %: 57 %
Platelets: 265 10*3/uL (ref 150–400)
RBC: 5.13 MIL/uL — ABNORMAL HIGH (ref 3.87–5.11)
RDW: 13.9 % (ref 11.5–15.5)
WBC: 7.2 10*3/uL (ref 4.0–10.5)
nRBC: 0 % (ref 0.0–0.2)

## 2022-06-20 LAB — TROPONIN I (HIGH SENSITIVITY): Troponin I (High Sensitivity): 3 ng/L (ref <18)

## 2022-06-20 NOTE — ED Triage Notes (Signed)
Pt reports having a headache Wednesday morning that has been intermittent since. Pt also endorses cough and N/V over the past few days. Denies abdominal pain, diarrhea, and fever.

## 2022-06-20 NOTE — ED Provider Triage Note (Signed)
Emergency Medicine Provider Triage Evaluation Note  Jeanette [name] , a 41 y.o. female  was evaluated in triage.  Pt complains of headache, vomiting, cough. She reports that ist started with a migraine on Thursday and she starting vomiting and felt dizzy with change of position. She reports it was only for the day, and doesn't have the vomiting or dizziness any longer. She now has a cough, chest pain, and SOB. She reports she feels like she can't get a deep breath in. CP not associated with cough.   Review of Systems  Positive:  Negative:   Physical Exam  BP (!) 120/109 (BP Location: Left Arm)   Pulse (!) 103   Temp 98.3 F (36.8 C) (Oral)   Resp 20   SpO2 97%  Gen:   Awake, no distress   Resp:  Normal effort  MSK:   Moves extremities without difficulty  Other:  Expiratory wheezing  Medical Decision Making  Medically screening exam initiated at 10:19 AM.  Appropriate orders placed.  Jeanette [name] was informed that the remainder of the evaluation will be completed by another provider, this initial triage assessment does not replace that evaluation, and the importance of remaining in the ED until their evaluation is complete.  Will order labs and COVID   Achille Rich, PA-C 06/20/22 1022
